# Patient Record
Sex: Female | Born: 1965 | Race: Asian | Hispanic: No | Smoking: Never smoker
Health system: Southern US, Community
[De-identification: ages and names within clinical notes are randomized; demographics above are authoritative.]

## PROBLEM LIST (undated history)

## (undated) DIAGNOSIS — I8393 Asymptomatic varicose veins of bilateral lower extremities: Secondary | ICD-10-CM

## (undated) DIAGNOSIS — E7800 Pure hypercholesterolemia, unspecified: Secondary | ICD-10-CM

## (undated) DIAGNOSIS — N951 Menopausal and female climacteric states: Secondary | ICD-10-CM

## (undated) DIAGNOSIS — G629 Polyneuropathy, unspecified: Secondary | ICD-10-CM

## (undated) DIAGNOSIS — J309 Allergic rhinitis, unspecified: Secondary | ICD-10-CM

## (undated) HISTORY — DX: Allergic rhinitis, unspecified: J30.9

## (undated) HISTORY — DX: Menopausal and female climacteric states: N95.1

## (undated) HISTORY — DX: Asymptomatic varicose veins of bilateral lower extremities: I83.93

## (undated) HISTORY — DX: Pure hypercholesterolemia, unspecified: E78.00

## (undated) HISTORY — DX: Polyneuropathy, unspecified: G62.9

---

## 1997-06-12 ENCOUNTER — Ambulatory Visit (HOSPITAL_COMMUNITY): Admission: RE | Admit: 1997-06-12 | Discharge: 1997-06-12 | Payer: Self-pay | Admitting: Obstetrics and Gynecology

## 1997-08-09 ENCOUNTER — Inpatient Hospital Stay (HOSPITAL_COMMUNITY): Admission: AD | Admit: 1997-08-09 | Discharge: 1997-08-10 | Payer: Self-pay | Admitting: Obstetrics and Gynecology

## 1998-11-29 ENCOUNTER — Other Ambulatory Visit: Admission: RE | Admit: 1998-11-29 | Discharge: 1998-11-29 | Payer: Self-pay | Admitting: Family Medicine

## 1999-05-11 ENCOUNTER — Encounter (INDEPENDENT_AMBULATORY_CARE_PROVIDER_SITE_OTHER): Payer: Self-pay

## 1999-05-11 ENCOUNTER — Other Ambulatory Visit: Admission: RE | Admit: 1999-05-11 | Discharge: 1999-05-11 | Payer: Self-pay | Admitting: Obstetrics and Gynecology

## 1999-09-09 ENCOUNTER — Other Ambulatory Visit: Admission: RE | Admit: 1999-09-09 | Discharge: 1999-09-09 | Payer: Self-pay | Admitting: Obstetrics and Gynecology

## 2000-01-05 ENCOUNTER — Other Ambulatory Visit: Admission: RE | Admit: 2000-01-05 | Discharge: 2000-01-05 | Payer: Self-pay | Admitting: Obstetrics and Gynecology

## 2001-01-07 ENCOUNTER — Other Ambulatory Visit: Admission: RE | Admit: 2001-01-07 | Discharge: 2001-01-07 | Payer: Self-pay | Admitting: Obstetrics and Gynecology

## 2002-01-30 ENCOUNTER — Other Ambulatory Visit: Admission: RE | Admit: 2002-01-30 | Discharge: 2002-01-30 | Payer: Self-pay | Admitting: Obstetrics and Gynecology

## 2002-07-12 ENCOUNTER — Ambulatory Visit (HOSPITAL_COMMUNITY): Admission: RE | Admit: 2002-07-12 | Discharge: 2002-07-12 | Payer: Self-pay | Admitting: Family Medicine

## 2002-07-12 ENCOUNTER — Encounter: Payer: Self-pay | Admitting: Family Medicine

## 2003-02-02 ENCOUNTER — Other Ambulatory Visit: Admission: RE | Admit: 2003-02-02 | Discharge: 2003-02-02 | Payer: Self-pay | Admitting: Obstetrics and Gynecology

## 2004-02-15 ENCOUNTER — Other Ambulatory Visit: Admission: RE | Admit: 2004-02-15 | Discharge: 2004-02-15 | Payer: Self-pay | Admitting: Obstetrics and Gynecology

## 2007-05-28 ENCOUNTER — Encounter: Admission: RE | Admit: 2007-05-28 | Discharge: 2007-05-28 | Payer: Self-pay | Admitting: Obstetrics and Gynecology

## 2008-06-30 ENCOUNTER — Encounter: Admission: RE | Admit: 2008-06-30 | Discharge: 2008-06-30 | Payer: Self-pay | Admitting: Obstetrics and Gynecology

## 2009-07-02 ENCOUNTER — Encounter: Admission: RE | Admit: 2009-07-02 | Discharge: 2009-07-02 | Payer: Self-pay | Admitting: Obstetrics and Gynecology

## 2010-01-30 ENCOUNTER — Encounter: Payer: Self-pay | Admitting: Family Medicine

## 2010-06-09 ENCOUNTER — Other Ambulatory Visit: Payer: Self-pay | Admitting: Obstetrics and Gynecology

## 2010-06-09 DIAGNOSIS — Z1231 Encounter for screening mammogram for malignant neoplasm of breast: Secondary | ICD-10-CM

## 2010-07-08 ENCOUNTER — Ambulatory Visit
Admission: RE | Admit: 2010-07-08 | Discharge: 2010-07-08 | Disposition: A | Discharge status: Home / Self Care | Payer: Commercial Indemnity | Source: Ambulatory Visit | Attending: Obstetrics and Gynecology | Admitting: Obstetrics and Gynecology

## 2010-07-08 DIAGNOSIS — Z1231 Encounter for screening mammogram for malignant neoplasm of breast: Secondary | ICD-10-CM

## 2011-06-28 ENCOUNTER — Other Ambulatory Visit: Payer: Self-pay | Admitting: Obstetrics and Gynecology

## 2011-06-28 DIAGNOSIS — Z1231 Encounter for screening mammogram for malignant neoplasm of breast: Secondary | ICD-10-CM

## 2011-07-12 ENCOUNTER — Ambulatory Visit
Admission: RE | Admit: 2011-07-12 | Discharge: 2011-07-12 | Disposition: A | Discharge status: Home / Self Care | Payer: Commercial Indemnity | Source: Ambulatory Visit | Attending: Obstetrics and Gynecology | Admitting: Obstetrics and Gynecology

## 2011-07-12 DIAGNOSIS — Z1231 Encounter for screening mammogram for malignant neoplasm of breast: Secondary | ICD-10-CM

## 2012-07-11 ENCOUNTER — Other Ambulatory Visit: Payer: Self-pay

## 2012-07-11 DIAGNOSIS — Z1231 Encounter for screening mammogram for malignant neoplasm of breast: Secondary | ICD-10-CM

## 2012-08-09 ENCOUNTER — Ambulatory Visit
Admission: RE | Admit: 2012-08-09 | Discharge: 2012-08-09 | Disposition: A | Discharge status: Home / Self Care | Payer: Commercial Indemnity | Source: Ambulatory Visit

## 2012-08-09 DIAGNOSIS — Z1231 Encounter for screening mammogram for malignant neoplasm of breast: Secondary | ICD-10-CM

## 2015-02-02 ENCOUNTER — Other Ambulatory Visit: Payer: Self-pay | Admitting: Family Medicine

## 2015-02-02 DIAGNOSIS — R1084 Generalized abdominal pain: Secondary | ICD-10-CM

## 2015-02-05 ENCOUNTER — Ambulatory Visit
Admission: RE | Admit: 2015-02-05 | Discharge: 2015-02-05 | Disposition: A | Discharge status: Home / Self Care | Payer: BLUE CROSS/BLUE SHIELD | Source: Ambulatory Visit | Attending: Family Medicine | Admitting: Family Medicine

## 2015-02-05 DIAGNOSIS — R1084 Generalized abdominal pain: Secondary | ICD-10-CM

## 2015-02-05 MED ORDER — IOPAMIDOL (ISOVUE-300) INJECTION 61%
100.0000 mL | Freq: Once | INTRAVENOUS | Status: AC | PRN
  Administered 2015-02-05: 100 mL via INTRAVENOUS

## 2015-09-17 DIAGNOSIS — Z130 Encounter for screening for diseases of the blood and blood-forming organs and certain disorders involving the immune mechanism: Secondary | ICD-10-CM | POA: Diagnosis not present

## 2015-09-17 DIAGNOSIS — Z1389 Encounter for screening for other disorder: Secondary | ICD-10-CM | POA: Diagnosis not present

## 2015-09-17 DIAGNOSIS — Z01419 Encounter for gynecological examination (general) (routine) without abnormal findings: Secondary | ICD-10-CM | POA: Diagnosis not present

## 2015-09-17 DIAGNOSIS — Z1231 Encounter for screening mammogram for malignant neoplasm of breast: Secondary | ICD-10-CM | POA: Diagnosis not present

## 2015-09-17 DIAGNOSIS — Z30431 Encounter for routine checking of intrauterine contraceptive device: Secondary | ICD-10-CM | POA: Diagnosis not present

## 2015-09-17 DIAGNOSIS — Z681 Body mass index (BMI) 19 or less, adult: Secondary | ICD-10-CM | POA: Diagnosis not present

## 2016-03-24 DIAGNOSIS — Z0000 Encounter for general adult medical examination without abnormal findings: Secondary | ICD-10-CM | POA: Diagnosis not present

## 2016-03-24 DIAGNOSIS — M79641 Pain in right hand: Secondary | ICD-10-CM | POA: Diagnosis not present

## 2016-03-24 DIAGNOSIS — E7800 Pure hypercholesterolemia, unspecified: Secondary | ICD-10-CM | POA: Diagnosis not present

## 2016-03-24 DIAGNOSIS — M25549 Pain in joints of unspecified hand: Secondary | ICD-10-CM | POA: Diagnosis not present

## 2016-03-24 DIAGNOSIS — M79642 Pain in left hand: Secondary | ICD-10-CM | POA: Diagnosis not present

## 2016-04-14 DIAGNOSIS — M65331 Trigger finger, right middle finger: Secondary | ICD-10-CM | POA: Diagnosis not present

## 2016-05-08 DIAGNOSIS — M40202 Unspecified kyphosis, cervical region: Secondary | ICD-10-CM | POA: Diagnosis not present

## 2016-05-08 DIAGNOSIS — M9901 Segmental and somatic dysfunction of cervical region: Secondary | ICD-10-CM | POA: Diagnosis not present

## 2016-05-08 DIAGNOSIS — M4722 Other spondylosis with radiculopathy, cervical region: Secondary | ICD-10-CM | POA: Diagnosis not present

## 2016-05-08 DIAGNOSIS — M5032 Other cervical disc degeneration, mid-cervical region, unspecified level: Secondary | ICD-10-CM | POA: Diagnosis not present

## 2016-05-10 DIAGNOSIS — M4722 Other spondylosis with radiculopathy, cervical region: Secondary | ICD-10-CM | POA: Diagnosis not present

## 2016-05-10 DIAGNOSIS — M40202 Unspecified kyphosis, cervical region: Secondary | ICD-10-CM | POA: Diagnosis not present

## 2016-05-10 DIAGNOSIS — M9901 Segmental and somatic dysfunction of cervical region: Secondary | ICD-10-CM | POA: Diagnosis not present

## 2016-05-10 DIAGNOSIS — M5032 Other cervical disc degeneration, mid-cervical region, unspecified level: Secondary | ICD-10-CM | POA: Diagnosis not present

## 2016-05-11 DIAGNOSIS — M4722 Other spondylosis with radiculopathy, cervical region: Secondary | ICD-10-CM | POA: Diagnosis not present

## 2016-05-11 DIAGNOSIS — M5032 Other cervical disc degeneration, mid-cervical region, unspecified level: Secondary | ICD-10-CM | POA: Diagnosis not present

## 2016-05-11 DIAGNOSIS — M40202 Unspecified kyphosis, cervical region: Secondary | ICD-10-CM | POA: Diagnosis not present

## 2016-05-11 DIAGNOSIS — M9901 Segmental and somatic dysfunction of cervical region: Secondary | ICD-10-CM | POA: Diagnosis not present

## 2016-05-15 DIAGNOSIS — M40202 Unspecified kyphosis, cervical region: Secondary | ICD-10-CM | POA: Diagnosis not present

## 2016-05-15 DIAGNOSIS — M9901 Segmental and somatic dysfunction of cervical region: Secondary | ICD-10-CM | POA: Diagnosis not present

## 2016-05-15 DIAGNOSIS — M4722 Other spondylosis with radiculopathy, cervical region: Secondary | ICD-10-CM | POA: Diagnosis not present

## 2016-05-15 DIAGNOSIS — M5032 Other cervical disc degeneration, mid-cervical region, unspecified level: Secondary | ICD-10-CM | POA: Diagnosis not present

## 2016-05-17 DIAGNOSIS — M9901 Segmental and somatic dysfunction of cervical region: Secondary | ICD-10-CM | POA: Diagnosis not present

## 2016-05-17 DIAGNOSIS — M40202 Unspecified kyphosis, cervical region: Secondary | ICD-10-CM | POA: Diagnosis not present

## 2016-05-17 DIAGNOSIS — M5032 Other cervical disc degeneration, mid-cervical region, unspecified level: Secondary | ICD-10-CM | POA: Diagnosis not present

## 2016-05-17 DIAGNOSIS — M4722 Other spondylosis with radiculopathy, cervical region: Secondary | ICD-10-CM | POA: Diagnosis not present

## 2016-05-18 DIAGNOSIS — M9901 Segmental and somatic dysfunction of cervical region: Secondary | ICD-10-CM | POA: Diagnosis not present

## 2016-05-18 DIAGNOSIS — M40202 Unspecified kyphosis, cervical region: Secondary | ICD-10-CM | POA: Diagnosis not present

## 2016-05-18 DIAGNOSIS — M5032 Other cervical disc degeneration, mid-cervical region, unspecified level: Secondary | ICD-10-CM | POA: Diagnosis not present

## 2016-05-18 DIAGNOSIS — M4722 Other spondylosis with radiculopathy, cervical region: Secondary | ICD-10-CM | POA: Diagnosis not present

## 2016-05-22 DIAGNOSIS — M9901 Segmental and somatic dysfunction of cervical region: Secondary | ICD-10-CM | POA: Diagnosis not present

## 2016-05-22 DIAGNOSIS — M5032 Other cervical disc degeneration, mid-cervical region, unspecified level: Secondary | ICD-10-CM | POA: Diagnosis not present

## 2016-05-22 DIAGNOSIS — M40202 Unspecified kyphosis, cervical region: Secondary | ICD-10-CM | POA: Diagnosis not present

## 2016-05-22 DIAGNOSIS — M4722 Other spondylosis with radiculopathy, cervical region: Secondary | ICD-10-CM | POA: Diagnosis not present

## 2016-05-24 DIAGNOSIS — M9901 Segmental and somatic dysfunction of cervical region: Secondary | ICD-10-CM | POA: Diagnosis not present

## 2016-05-24 DIAGNOSIS — M40202 Unspecified kyphosis, cervical region: Secondary | ICD-10-CM | POA: Diagnosis not present

## 2016-05-24 DIAGNOSIS — M4722 Other spondylosis with radiculopathy, cervical region: Secondary | ICD-10-CM | POA: Diagnosis not present

## 2016-05-24 DIAGNOSIS — M5032 Other cervical disc degeneration, mid-cervical region, unspecified level: Secondary | ICD-10-CM | POA: Diagnosis not present

## 2016-05-25 DIAGNOSIS — M9901 Segmental and somatic dysfunction of cervical region: Secondary | ICD-10-CM | POA: Diagnosis not present

## 2016-05-25 DIAGNOSIS — M4722 Other spondylosis with radiculopathy, cervical region: Secondary | ICD-10-CM | POA: Diagnosis not present

## 2016-05-25 DIAGNOSIS — M40202 Unspecified kyphosis, cervical region: Secondary | ICD-10-CM | POA: Diagnosis not present

## 2016-05-25 DIAGNOSIS — M5032 Other cervical disc degeneration, mid-cervical region, unspecified level: Secondary | ICD-10-CM | POA: Diagnosis not present

## 2016-05-29 DIAGNOSIS — M5032 Other cervical disc degeneration, mid-cervical region, unspecified level: Secondary | ICD-10-CM | POA: Diagnosis not present

## 2016-05-29 DIAGNOSIS — M9901 Segmental and somatic dysfunction of cervical region: Secondary | ICD-10-CM | POA: Diagnosis not present

## 2016-05-29 DIAGNOSIS — M40202 Unspecified kyphosis, cervical region: Secondary | ICD-10-CM | POA: Diagnosis not present

## 2016-05-29 DIAGNOSIS — M4722 Other spondylosis with radiculopathy, cervical region: Secondary | ICD-10-CM | POA: Diagnosis not present

## 2016-06-01 DIAGNOSIS — M40202 Unspecified kyphosis, cervical region: Secondary | ICD-10-CM | POA: Diagnosis not present

## 2016-06-01 DIAGNOSIS — M9901 Segmental and somatic dysfunction of cervical region: Secondary | ICD-10-CM | POA: Diagnosis not present

## 2016-06-01 DIAGNOSIS — M5032 Other cervical disc degeneration, mid-cervical region, unspecified level: Secondary | ICD-10-CM | POA: Diagnosis not present

## 2016-06-01 DIAGNOSIS — M4722 Other spondylosis with radiculopathy, cervical region: Secondary | ICD-10-CM | POA: Diagnosis not present

## 2016-06-06 DIAGNOSIS — M40202 Unspecified kyphosis, cervical region: Secondary | ICD-10-CM | POA: Diagnosis not present

## 2016-06-06 DIAGNOSIS — M5032 Other cervical disc degeneration, mid-cervical region, unspecified level: Secondary | ICD-10-CM | POA: Diagnosis not present

## 2016-06-06 DIAGNOSIS — M4722 Other spondylosis with radiculopathy, cervical region: Secondary | ICD-10-CM | POA: Diagnosis not present

## 2016-06-06 DIAGNOSIS — M9901 Segmental and somatic dysfunction of cervical region: Secondary | ICD-10-CM | POA: Diagnosis not present

## 2016-06-08 DIAGNOSIS — M40202 Unspecified kyphosis, cervical region: Secondary | ICD-10-CM | POA: Diagnosis not present

## 2016-06-08 DIAGNOSIS — M5032 Other cervical disc degeneration, mid-cervical region, unspecified level: Secondary | ICD-10-CM | POA: Diagnosis not present

## 2016-06-08 DIAGNOSIS — M4722 Other spondylosis with radiculopathy, cervical region: Secondary | ICD-10-CM | POA: Diagnosis not present

## 2016-06-08 DIAGNOSIS — M9901 Segmental and somatic dysfunction of cervical region: Secondary | ICD-10-CM | POA: Diagnosis not present

## 2016-06-12 DIAGNOSIS — G629 Polyneuropathy, unspecified: Secondary | ICD-10-CM | POA: Diagnosis not present

## 2016-06-12 DIAGNOSIS — I8393 Asymptomatic varicose veins of bilateral lower extremities: Secondary | ICD-10-CM | POA: Diagnosis not present

## 2016-06-14 DIAGNOSIS — M4722 Other spondylosis with radiculopathy, cervical region: Secondary | ICD-10-CM | POA: Diagnosis not present

## 2016-06-14 DIAGNOSIS — M9901 Segmental and somatic dysfunction of cervical region: Secondary | ICD-10-CM | POA: Diagnosis not present

## 2016-06-14 DIAGNOSIS — M40202 Unspecified kyphosis, cervical region: Secondary | ICD-10-CM | POA: Diagnosis not present

## 2016-06-14 DIAGNOSIS — M5032 Other cervical disc degeneration, mid-cervical region, unspecified level: Secondary | ICD-10-CM | POA: Diagnosis not present

## 2016-06-15 DIAGNOSIS — M40202 Unspecified kyphosis, cervical region: Secondary | ICD-10-CM | POA: Diagnosis not present

## 2016-06-15 DIAGNOSIS — M5032 Other cervical disc degeneration, mid-cervical region, unspecified level: Secondary | ICD-10-CM | POA: Diagnosis not present

## 2016-06-15 DIAGNOSIS — M9901 Segmental and somatic dysfunction of cervical region: Secondary | ICD-10-CM | POA: Diagnosis not present

## 2016-06-15 DIAGNOSIS — M4722 Other spondylosis with radiculopathy, cervical region: Secondary | ICD-10-CM | POA: Diagnosis not present

## 2016-06-19 DIAGNOSIS — M9901 Segmental and somatic dysfunction of cervical region: Secondary | ICD-10-CM | POA: Diagnosis not present

## 2016-06-19 DIAGNOSIS — M4722 Other spondylosis with radiculopathy, cervical region: Secondary | ICD-10-CM | POA: Diagnosis not present

## 2016-06-19 DIAGNOSIS — M5032 Other cervical disc degeneration, mid-cervical region, unspecified level: Secondary | ICD-10-CM | POA: Diagnosis not present

## 2016-06-19 DIAGNOSIS — M40202 Unspecified kyphosis, cervical region: Secondary | ICD-10-CM | POA: Diagnosis not present

## 2016-07-03 ENCOUNTER — Other Ambulatory Visit: Payer: Self-pay

## 2016-07-03 DIAGNOSIS — I83813 Varicose veins of bilateral lower extremities with pain: Secondary | ICD-10-CM

## 2016-07-11 ENCOUNTER — Encounter: Payer: Self-pay | Admitting: Vascular Surgery

## 2016-08-22 ENCOUNTER — Encounter: Payer: Self-pay | Admitting: Vascular Surgery

## 2016-08-23 DIAGNOSIS — G629 Polyneuropathy, unspecified: Secondary | ICD-10-CM | POA: Diagnosis not present

## 2016-08-23 DIAGNOSIS — M2550 Pain in unspecified joint: Secondary | ICD-10-CM | POA: Diagnosis not present

## 2016-09-01 ENCOUNTER — Encounter: Payer: Self-pay | Admitting: Vascular Surgery

## 2016-09-01 ENCOUNTER — Ambulatory Visit (INDEPENDENT_AMBULATORY_CARE_PROVIDER_SITE_OTHER): Payer: BLUE CROSS/BLUE SHIELD | Admitting: Vascular Surgery

## 2016-09-01 ENCOUNTER — Ambulatory Visit (HOSPITAL_COMMUNITY)
Admission: RE | Admit: 2016-09-01 | Discharge: 2016-09-01 | Disposition: A | Discharge status: Home / Self Care | Payer: BLUE CROSS/BLUE SHIELD | Source: Ambulatory Visit | Attending: Vascular Surgery | Admitting: Vascular Surgery

## 2016-09-01 VITALS — BP 114/75 | HR 67 | Temp 97.4°F | Ht 61.0 in | Wt 108.1 lb

## 2016-09-01 DIAGNOSIS — I83813 Varicose veins of bilateral lower extremities with pain: Secondary | ICD-10-CM

## 2016-09-01 DIAGNOSIS — L97919 Non-pressure chronic ulcer of unspecified part of right lower leg with unspecified severity: Secondary | ICD-10-CM | POA: Diagnosis not present

## 2016-09-01 DIAGNOSIS — I83019 Varicose veins of right lower extremity with ulcer of unspecified site: Secondary | ICD-10-CM

## 2016-09-01 DIAGNOSIS — M7989 Other specified soft tissue disorders: Secondary | ICD-10-CM | POA: Diagnosis not present

## 2016-09-01 NOTE — Progress Notes (Signed)
Patient ID: Ebony Ramirez, female   DOB: 1965/07/13, 51 y.o.   MRN: [id number]  Reason for Consult: Establish Care   Referred by Johny Blamer, MD  Subjective:     HPI:  Ebony Ramirez is a 51 y.o. female for evaluation of right lower leg pain. She states that she has had some pain that came on suddenly and has not gone away and is associated with some small varicosities in the leg. She is not had x-ray. She was attempted anti-inflammatory medications but have not helped. She states that does not help with rest and is exacerbated with walking and any pressure applied. She is able to continue to walk overall continues to work. She does not have rest pain or tissue loss. She does not have cramping with walking. She has no history of vascular disease.  Past Medical History:  Diagnosis Date  . Perimenopausal   . Peripheral neuropathy   . Pure hypercholesterolemia   . Rhinitis, allergic   . Varicose veins of legs    Family History  Problem Relation Age of Onset  . Stroke Mother   . Hyperlipidemia Mother   . Hypertension Mother   . Diabetes Mother    History reviewed. No pertinent surgical history.  Short Social History:  Social History  Substance Use Topics  . Smoking status: Never Smoker  . Smokeless tobacco: Never Used  . Alcohol use No    No Known Allergies  Current Outpatient Prescriptions  Medication Sig Dispense Refill  . azelastine (OPTIVAR) 0.05 % ophthalmic solution 1 drop 2 (two) times daily.    . fluticasone (FLONASE) 50 MCG/ACT nasal spray Place into both nostrils daily.    . meloxicam (MOBIC) 15 MG tablet Take 15 mg by mouth daily.    Marland Kitchen olopatadine (PATANOL) 0.1 % ophthalmic solution 1 drop 2 (two) times daily.    . pregabalin (LYRICA) 75 MG capsule Take 75 mg by mouth 2 (two) times daily.    Marland Kitchen triamcinolone cream (KENALOG) 0.1 % Apply 1 application topically 2 (two) times daily.     No current facility-administered medications for this visit.      Review of Systems  Constitutional:  Constitutional negative. Eyes: Eyes negative.  Respiratory: Respiratory negative.  Cardiovascular: Cardiovascular negative.  GI: Gastrointestinal negative.  Musculoskeletal: Positive for leg pain and joint pain.  Neurological: Neurological negative. Hematologic: Hematologic/lymphatic negative.  Psychiatric: Psychiatric negative.        Objective:  Objective   Vitals:   09/01/16 1354  BP: 114/75  Pulse: 67  Temp: (!) 97.4 F (36.3 C)  TempSrc: Oral  SpO2: 100%  Weight: 108 lb 1.6 oz (49 kg)  Height: 5\' 1"  (1.549 m)   Body mass index is 20.43 kg/m.  Physical Exam  Constitutional: She is oriented to person, place, and time. She appears well-developed.  HENT:  Head: Normocephalic.  Neck: Normal range of motion. Neck supple.  Cardiovascular: Normal rate.   Pulses:      Carotid pulses are 2+ on the right side, and 2+ on the left side.      Popliteal pulses are 2+ on the right side, and 2+ on the left side.       Dorsalis pedis pulses are 2+ on the right side, and 2+ on the left side.  Pulmonary/Chest: Effort normal.  Abdominal: Soft. She exhibits no mass.  Musculoskeletal:  Very few veins noted near right ankle, no varicosities or spider veins  Lymphadenopathy:    She has no  cervical adenopathy.  Neurological: She is alert and oriented to person, place, and time.  Skin: Skin is warm and dry.  Psychiatric: She has a normal mood and affect. Judgment and thought content normal.    Data: I apparently interpreted her lower extremity venous duplex evaluation which shows venous incompetence noted in the bilateral small saphenous veins although greatest diameter is under 0.3 cm. She also has incompetence noted in the right common femoral femoral saphenofemoral junction and great saphenous vein which is for very short segments in this to is less than 0.4 cm in size.     Assessment/Plan:     51 year-old female here for venous  evaluation of bilateral lower extremities where she has pain in her right ankle. She has never had a DVT in the pain does not appear to be coming from any varicosities. As such she has no reflux in these veins there is no vascular intervention that I would recommend at this time. If she has continued pain despite anti-inflammatory medications I have suggested possible x-ray but no vascular intervention that I can think of. She demonstrates good understanding and is happy with not eating procedures. She can follow-up when necessary.     Maeola Harman MD [facility]

## 2016-10-19 DIAGNOSIS — Z30432 Encounter for removal of intrauterine contraceptive device: Secondary | ICD-10-CM | POA: Diagnosis not present

## 2016-10-19 DIAGNOSIS — Z1389 Encounter for screening for other disorder: Secondary | ICD-10-CM | POA: Diagnosis not present

## 2016-10-19 DIAGNOSIS — Z01419 Encounter for gynecological examination (general) (routine) without abnormal findings: Secondary | ICD-10-CM | POA: Diagnosis not present

## 2016-10-19 DIAGNOSIS — Z130 Encounter for screening for diseases of the blood and blood-forming organs and certain disorders involving the immune mechanism: Secondary | ICD-10-CM | POA: Diagnosis not present

## 2016-10-19 DIAGNOSIS — Z1231 Encounter for screening mammogram for malignant neoplasm of breast: Secondary | ICD-10-CM | POA: Diagnosis not present

## 2016-10-19 DIAGNOSIS — N951 Menopausal and female climacteric states: Secondary | ICD-10-CM | POA: Diagnosis not present

## 2016-10-19 DIAGNOSIS — Z6820 Body mass index (BMI) 20.0-20.9, adult: Secondary | ICD-10-CM | POA: Diagnosis not present

## 2017-03-30 DIAGNOSIS — Z0000 Encounter for general adult medical examination without abnormal findings: Secondary | ICD-10-CM | POA: Diagnosis not present

## 2017-03-30 DIAGNOSIS — E7800 Pure hypercholesterolemia, unspecified: Secondary | ICD-10-CM | POA: Diagnosis not present

## 2017-04-26 DIAGNOSIS — Z1211 Encounter for screening for malignant neoplasm of colon: Secondary | ICD-10-CM | POA: Diagnosis not present

## 2017-04-26 DIAGNOSIS — Z01818 Encounter for other preprocedural examination: Secondary | ICD-10-CM | POA: Diagnosis not present

## 2017-06-01 DIAGNOSIS — K6389 Other specified diseases of intestine: Secondary | ICD-10-CM | POA: Diagnosis not present

## 2017-06-01 DIAGNOSIS — Z1211 Encounter for screening for malignant neoplasm of colon: Secondary | ICD-10-CM | POA: Diagnosis not present

## 2017-06-01 DIAGNOSIS — K635 Polyp of colon: Secondary | ICD-10-CM | POA: Diagnosis not present

## 2017-06-01 DIAGNOSIS — K633 Ulcer of intestine: Secondary | ICD-10-CM | POA: Diagnosis not present

## 2017-06-01 DIAGNOSIS — K529 Noninfective gastroenteritis and colitis, unspecified: Secondary | ICD-10-CM | POA: Diagnosis not present

## 2017-06-08 DIAGNOSIS — K635 Polyp of colon: Secondary | ICD-10-CM | POA: Diagnosis not present

## 2017-06-08 DIAGNOSIS — K529 Noninfective gastroenteritis and colitis, unspecified: Secondary | ICD-10-CM | POA: Diagnosis not present

## 2017-06-08 DIAGNOSIS — Z1211 Encounter for screening for malignant neoplasm of colon: Secondary | ICD-10-CM | POA: Diagnosis not present

## 2017-08-06 DIAGNOSIS — K559 Vascular disorder of intestine, unspecified: Secondary | ICD-10-CM | POA: Diagnosis not present

## 2017-09-09 IMAGING — CT CT ABD-PELV W/ CM
2 of 5 series · 10 of 36 positions shown, 17 images · IV contrast (READICAT/WATER & [ID] ISOVUE 300)
Comparison: None.

CLINICAL DATA: Generalized abdominal pain for 4 weeks. Occasional
melena. Initial encounter.

EXAM:
CT ABDOMEN AND PELVIS WITH CONTRAST
TECHNIQUE: Multidetector CT imaging of the abdomen and pelvis was performed
using the standard protocol following bolus administration of
intravenous contrast.
CONTRAST:  100 mL 9KJAUM-V55 IOPAMIDOL (9KJAUM-V55) INJECTION 61%

[Series 3: abd/pelvis with · axial · 0.61mm/px · z∈[-313,+42]mm · 9 of 89 slices shown, 15 images]
[im 9/89  soft-tissue]
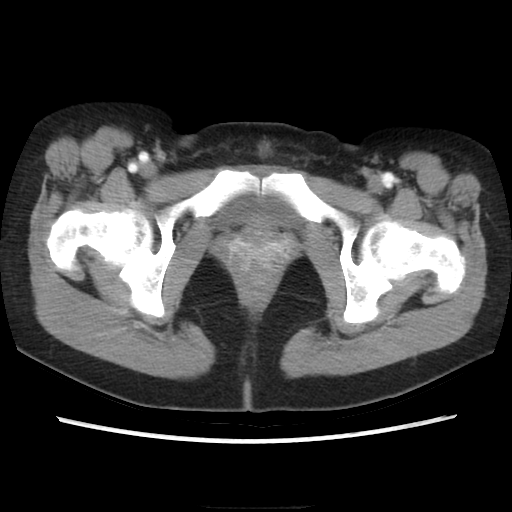
[im 9/89  bone]
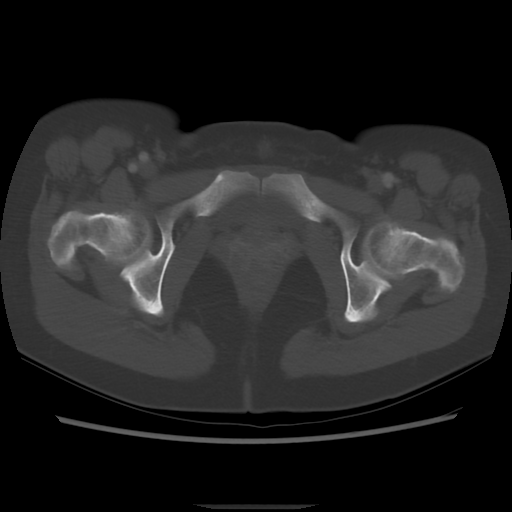
[im 18/89  soft-tissue]
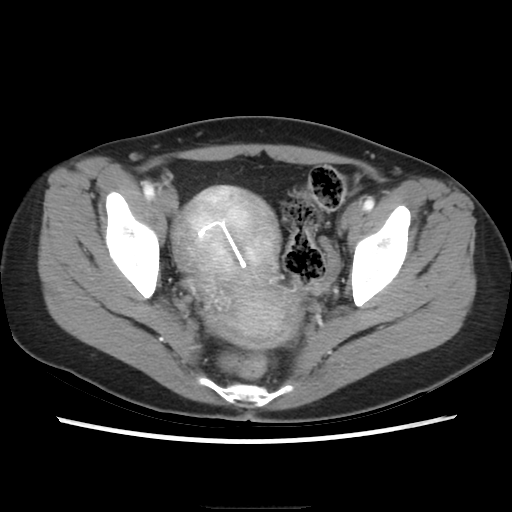
[im 27/89  soft-tissue]
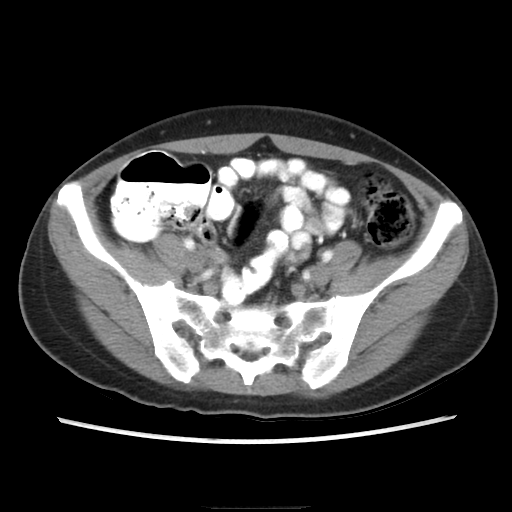
[im 36/89  soft-tissue]
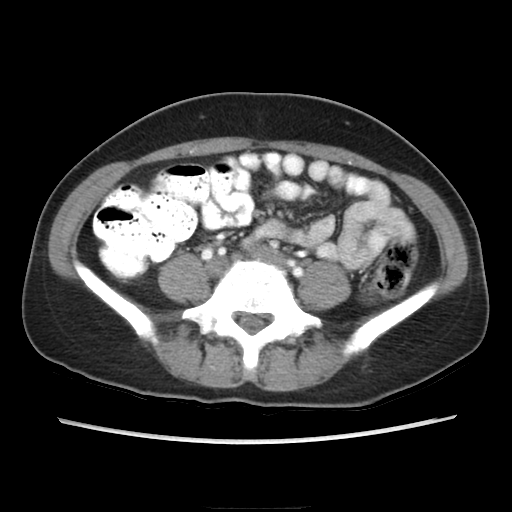
[im 45/89  soft-tissue]
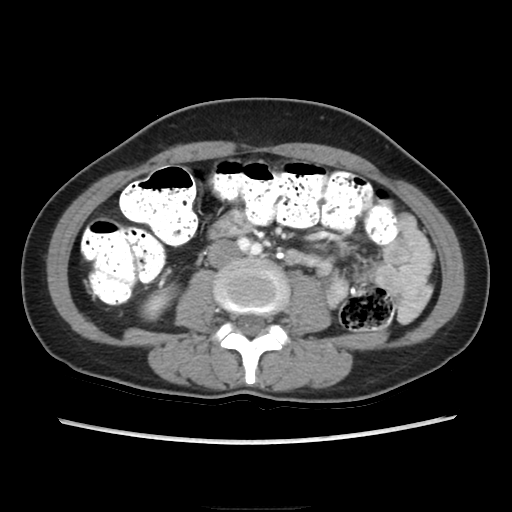
[im 53/89  soft-tissue]
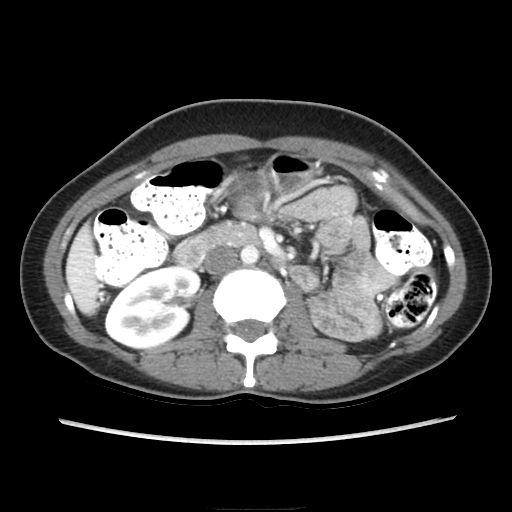
[im 53/89  lung]
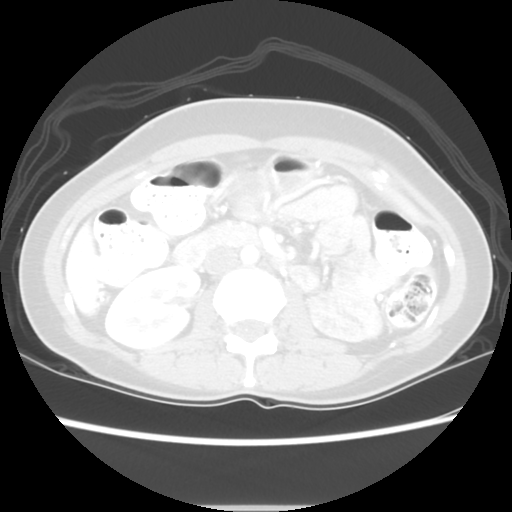
[im 62/89  soft-tissue]
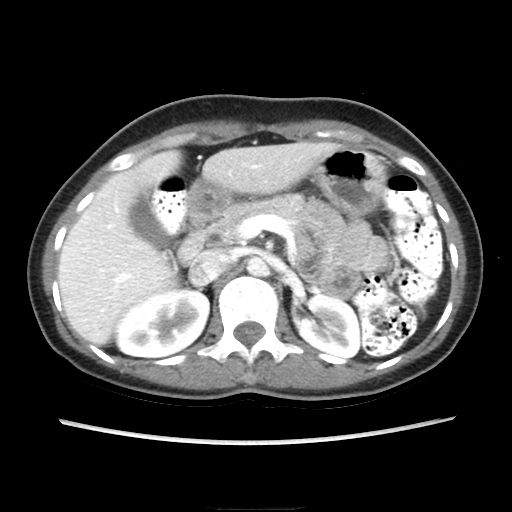
[im 62/89  lung]
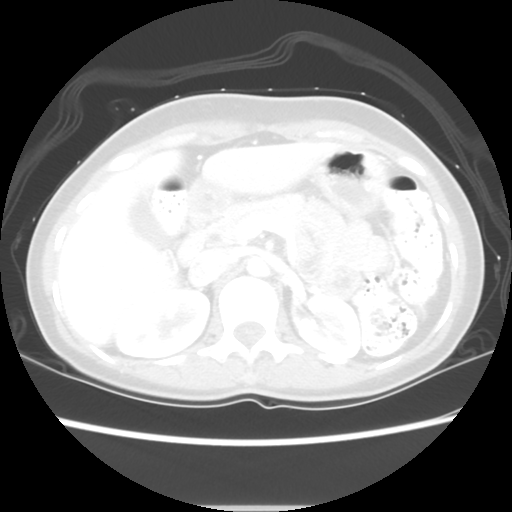
[im 71/89  soft-tissue]
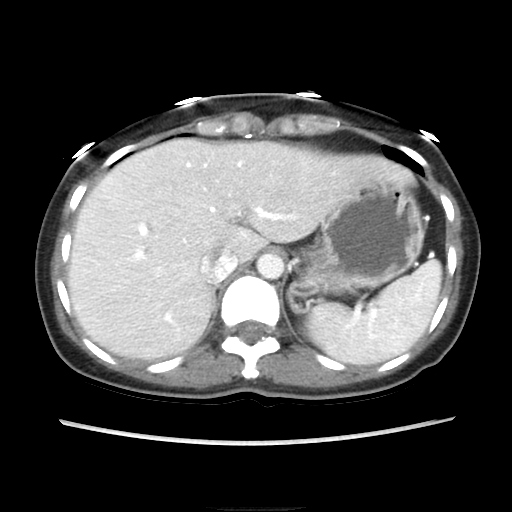
[im 71/89  lung]
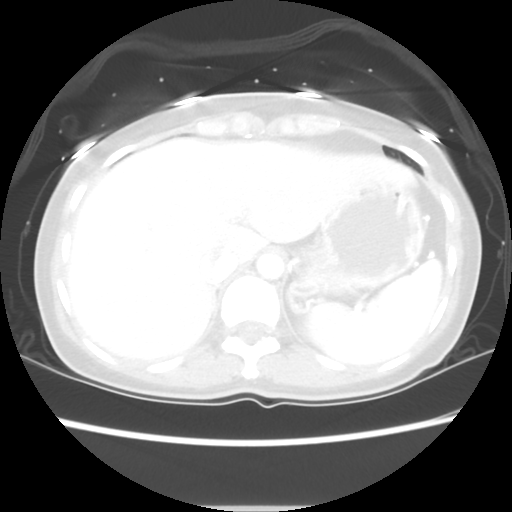
[im 80/89  soft-tissue]
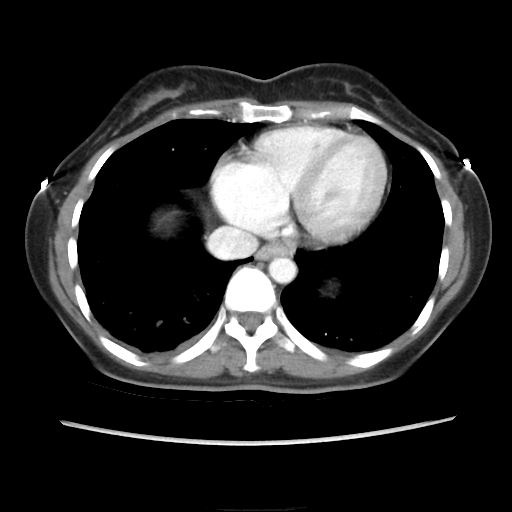
[im 80/89  lung]
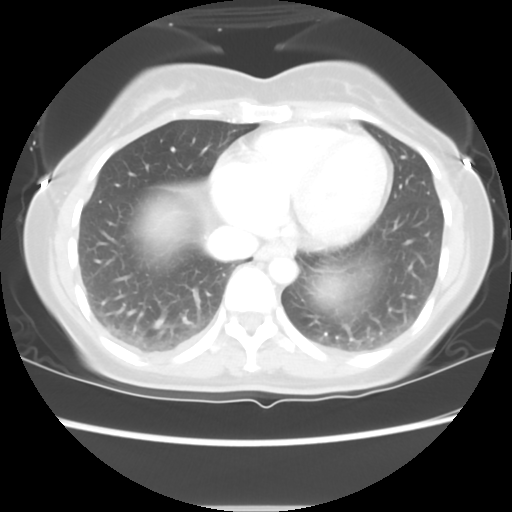
[im 80/89  bone]
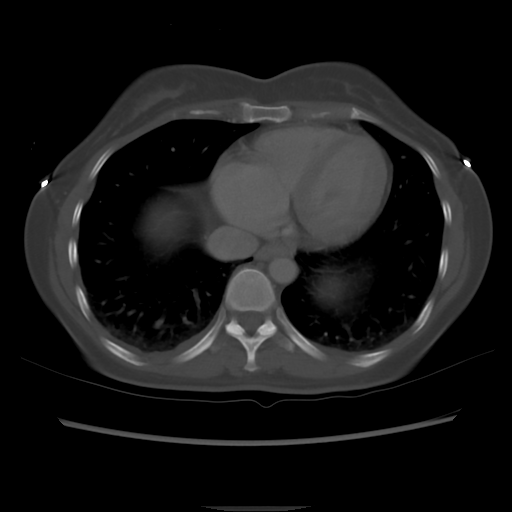

[Series 601: coronal body · coronal · 0.92mm/px · 1 of 98 slices shown, 2 images]
[im 33/98  soft-tissue]
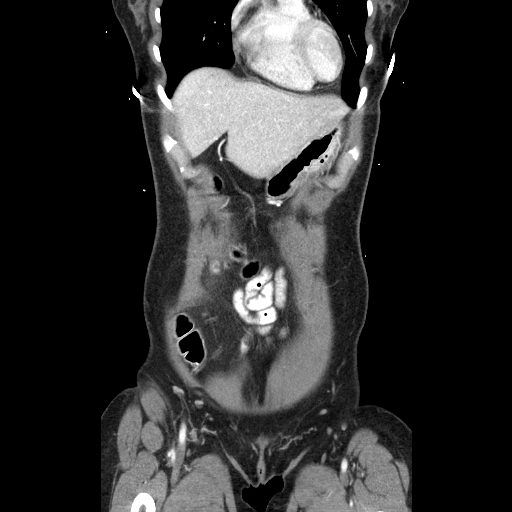
[im 33/98  bone]
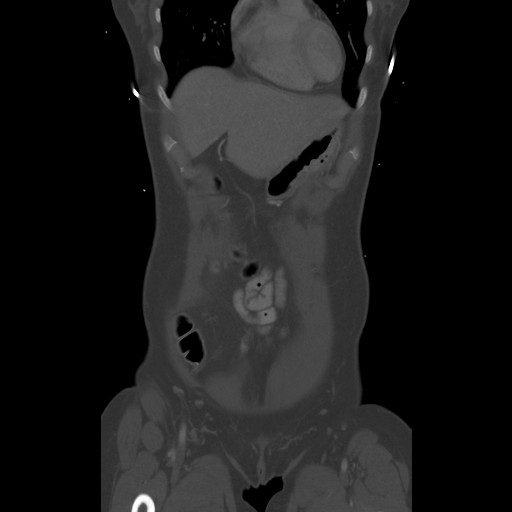

[10 of 36 positions shown; findings below may reference images not displayed]

FINDINGS: The lung bases are clear. No pleural or pericardial effusion. Heart
size is normal.

The gallbladder, liver, spleen, adrenal glands, pancreas and kidneys
appear normal.

IUD is in place in the uterus. Adnexa are unremarkable. Prominent
stool in the ascending and transverse colon is identified. The colon
otherwise appears normal. The stomach, small bowel and appendix are
unremarkable. There is no lymphadenopathy or fluid. No focal bony
abnormality is identified.
IMPRESSION: No acute finding abdomen or pelvis.

Prominent stool burden ascending and transverse colon.

## 2017-10-26 DIAGNOSIS — Z01419 Encounter for gynecological examination (general) (routine) without abnormal findings: Secondary | ICD-10-CM | POA: Diagnosis not present

## 2017-10-26 DIAGNOSIS — Z6820 Body mass index (BMI) 20.0-20.9, adult: Secondary | ICD-10-CM | POA: Diagnosis not present

## 2017-10-26 DIAGNOSIS — Z1231 Encounter for screening mammogram for malignant neoplasm of breast: Secondary | ICD-10-CM | POA: Diagnosis not present

## 2017-10-26 DIAGNOSIS — Z130 Encounter for screening for diseases of the blood and blood-forming organs and certain disorders involving the immune mechanism: Secondary | ICD-10-CM | POA: Diagnosis not present

## 2017-10-26 DIAGNOSIS — N921 Excessive and frequent menstruation with irregular cycle: Secondary | ICD-10-CM | POA: Diagnosis not present

## 2017-10-26 DIAGNOSIS — Z1389 Encounter for screening for other disorder: Secondary | ICD-10-CM | POA: Diagnosis not present

## 2017-11-02 ENCOUNTER — Other Ambulatory Visit: Payer: Self-pay | Admitting: Obstetrics and Gynecology

## 2017-11-02 DIAGNOSIS — N6489 Other specified disorders of breast: Secondary | ICD-10-CM

## 2017-11-09 ENCOUNTER — Ambulatory Visit: Payer: BLUE CROSS/BLUE SHIELD

## 2017-11-09 ENCOUNTER — Ambulatory Visit
Admission: RE | Admit: 2017-11-09 | Discharge: 2017-11-09 | Disposition: A | Discharge status: Home / Self Care | Payer: BLUE CROSS/BLUE SHIELD | Source: Ambulatory Visit | Attending: Obstetrics and Gynecology | Admitting: Obstetrics and Gynecology

## 2017-11-09 DIAGNOSIS — N6489 Other specified disorders of breast: Secondary | ICD-10-CM

## 2017-11-09 DIAGNOSIS — R922 Inconclusive mammogram: Secondary | ICD-10-CM | POA: Diagnosis not present

## 2018-03-02 ENCOUNTER — Encounter (HOSPITAL_COMMUNITY): Payer: Self-pay

## 2018-03-02 ENCOUNTER — Other Ambulatory Visit: Payer: Self-pay

## 2018-03-02 ENCOUNTER — Emergency Department (HOSPITAL_COMMUNITY)
Admission: EM | Admit: 2018-03-02 | Discharge: 2018-03-02 | Disposition: A | Discharge status: Home / Self Care | Payer: 59 | Attending: Emergency Medicine | Admitting: Emergency Medicine

## 2018-03-02 DIAGNOSIS — J385 Laryngeal spasm: Secondary | ICD-10-CM | POA: Diagnosis not present

## 2018-03-02 DIAGNOSIS — R05 Cough: Secondary | ICD-10-CM | POA: Diagnosis not present

## 2018-03-02 DIAGNOSIS — Z79899 Other long term (current) drug therapy: Secondary | ICD-10-CM | POA: Insufficient documentation

## 2018-03-02 LAB — GROUP A STREP BY PCR: Group A Strep by PCR: NOT DETECTED

## 2018-03-02 MED ORDER — FAMOTIDINE 20 MG PO TABS: 20.0000 mg | ORAL_TABLET | Freq: Two times a day (BID) | ORAL | 0 refills | Status: AC

## 2018-03-02 MED ORDER — IBUPROFEN 600 MG PO TABS: 600.0000 mg | ORAL_TABLET | Freq: Three times a day (TID) | ORAL | 0 refills | Status: AC | PRN

## 2018-03-02 MED ORDER — IBUPROFEN 800 MG PO TABS
800.0000 mg | ORAL_TABLET | Freq: Once | ORAL | Status: AC
  Administered 2018-03-02: 800 mg via ORAL
  Filled 2018-03-02: qty 1

## 2018-03-02 NOTE — ED Triage Notes (Signed)
Pt out shopping today when she had sudden onset of difficulty breathing; pt says she "feels like something is stuck in my throat"; pt endorses similar event 3 weeks ago, resolved on its own;pt endorses pain in throat;  NAD at this time

## 2018-03-02 NOTE — ED Notes (Signed)
Patient verbalizes understanding of discharge instructions. Opportunity for questioning and answers were provided. Pt discharged from ED. 

## 2018-03-02 NOTE — Discharge Instructions (Addendum)
1.  You appear to have had an episode of spasm of the vocal cords.  Sometimes this can be triggered by having had a virus and irritation of the vocal cords, reflux, chemical or inhaled irritants or stressors.  A strep test was checked and was negative for signs of strep throat.  You do not have redness or swelling of your tonsils or membranes in your mouth or throat. 2.  You should contact the ear nose throat doctor in your discharge instructions to set up a follow-up appointment for direct examination of your vocal cords.  You have had information put on your chart including information regarding inducible laryngeal obstruction and voice disorders for additional information about laryngeal spasm. 3.  Take ibuprofen as directed to help with discomfort and inflammation.  Take Pepcid twice daily as prescribed to prevent any reflux or irritation of your stomach while taking ibuprofen. 4.  Return to the emergency department if you develop symptoms such as neck stiffness, increasing sore throat, fever, difficulty breathing or swallowing or other concerning symptoms.

## 2018-03-02 NOTE — ED Provider Notes (Signed)
[facility] EMERGENCY DEPARTMENT Provider Note   CSN: [id number] Arrival date & time: 03/02/18  1645    History   Chief Complaint No chief complaint on file.   HPI Ebony Ramirez is a 53 y.o. female.     HPI Patient reports she had flu about 2 weeks ago.  At that time she was having a lot of coughing and generalized aching.  She reports that she was getting better.  She woke up this morning feeling all right and went to do some shopping.  She reports as she was shopping she got a sudden sensation of "something being stuck in my throat".  She indicates her upper airway around the thyroid cartilage.  She reports that this suddenly felt like it was tight and hard to take a breath.  Those symptoms started to resolve on their own.  She reports she still feels like she has some sore throat and a little bit of pain with swallowing.  Patient is very concerned that this might happen again.  No chest pain, no ongoing cough.  No shortness of breath. Past Medical History:  Diagnosis Date  . Perimenopausal   . Peripheral neuropathy   . Pure hypercholesterolemia   . Rhinitis, allergic   . Varicose veins of legs     There are no active problems to display for this patient.   History reviewed. No pertinent surgical history.   OB History   No obstetric history on file.      Home Medications    Prior to Admission medications   Medication Sig Start Date End Date Taking? Authorizing Provider  ibuprofen (ADVIL,MOTRIN) 200 MG tablet Take 400 mg by mouth every 6 (six) hours as needed for mild pain.   Yes [provider]  Multiple Vitamin (MULTIVITAMIN WITH MINERALS) TABS tablet Take 1 tablet by mouth daily.   Yes [provider]  pregabalin (LYRICA) 75 MG capsule Take 75 mg by mouth 2 (two) times daily.   Yes [provider]  famotidine (PEPCID) 20 MG tablet Take 1 tablet (20 mg total) by mouth 2 (two) times daily. 03/02/18   Arby Barrette, MD    ibuprofen (ADVIL,MOTRIN) 600 MG tablet Take 1 tablet (600 mg total) by mouth every 8 (eight) hours as needed for mild pain or moderate pain. 03/02/18   Arby Barrette, MD    Family History Family History  Problem Relation Age of Onset  . Stroke Mother   . Hyperlipidemia Mother   . Hypertension Mother   . Diabetes Mother     Social History Social History   Tobacco Use  . Smoking status: Never Smoker  . Smokeless tobacco: Never Used  Substance Use Topics  . Alcohol use: No  . Drug use: No     Allergies   Patient has no known allergies.   Review of Systems Review of Systems 10 Systems reviewed and are negative for acute change except as noted in the HPI.   Physical Exam Updated Vital Signs BP (!) 144/86   Pulse 62   Temp 98.1 F (36.7 C) (Oral)   Resp 18   Ht 5\' 1"  (1.549 m)   Wt 47.6 kg   SpO2 98%   BMI 19.84 kg/m   Physical Exam Constitutional:      Appearance: Normal appearance. She is well-developed.  HENT:     Head: Normocephalic and atraumatic.     Nose: Nose normal.     Mouth/Throat:     Mouth:  Mucous membranes are moist.     Pharynx: Oropharynx is clear.     Comments: Posterior airway is widely patent.  No erythema or exudates.  No masses or lesions.  Tongue good condition.  Dentition very good condition. Eyes:     Pupils: Pupils are equal, round, and reactive to light.  Neck:     Musculoskeletal: Neck supple.     Comments: Neck is supple.  There is no meningismus.  Anterior neck no palpable thyromegaly.  Patient endorses some discomfort to palpation around the thyroid cartilage.  No appreciable enlargement, no stridor.  No lymphadenopathy. Cardiovascular:     Rate and Rhythm: Normal rate and regular rhythm.     Heart sounds: Normal heart sounds.  Pulmonary:     Effort: Pulmonary effort is normal.     Breath sounds: Normal breath sounds.  Abdominal:     General: Bowel sounds are normal. There is no distension.     Palpations: Abdomen is  soft.     Tenderness: There is no abdominal tenderness.  Musculoskeletal: Normal range of motion.     Right lower leg: No edema.     Left lower leg: No edema.  Skin:    General: Skin is warm and dry.  Neurological:     General: No focal deficit present.     Mental Status: She is alert and oriented to person, place, and time.     GCS: GCS eye subscore is 4. GCS verbal subscore is 5. GCS motor subscore is 6.     Coordination: Coordination normal.      ED Treatments / Results  Labs (all labs ordered are listed, but only abnormal results are displayed) Labs Reviewed  GROUP A STREP BY PCR    EKG None  Radiology No results found.  Procedures Procedures (including critical care time)  Medications Ordered in ED Medications  ibuprofen (ADVIL,MOTRIN) tablet 800 mg (800 mg Oral Given 03/02/18 1737)     Initial Impression / Assessment and Plan / ED Course  I have reviewed the triage vital signs and the nursing notes.  Pertinent labs & imaging results that were available during my care of the patient were reviewed by me and considered in my medical decision making (see chart for details).        Patient is clinically well in appearance with normal vital signs.  She describes an episode while she was shopping at the store where she felt like her throat just suddenly closed and became tight right around her thyroid cartilage.  She felt like for a few seconds she could not breathe.  Symptoms then resolved.  She denies there was any associated chest pain or residual difficulty breathing once that sensation in her throat resolved.  Patient describes flulike symptoms for the past 2 weeks.  At this time I have suspicion that she had laryngeal spasm.  This appears completely resolved.  Patient's voice is not hoarse.  She does not have any objective physical exam signs on the head or neck.  At this time I feel she is stable for discharge.  My recommendations to take ibuprofen and Pepcid.   Patient was very concerned that this might occur again.  I have advised that she will need to return if she has any advancing or changing symptoms.  However at this time I feel she is stable to try outpatient treatment and follow-up with ENT.  Final Clinical Impressions(s) / ED Diagnoses   Final diagnoses:  Laryngospasm  ED Discharge Orders         Ordered    ibuprofen (ADVIL,MOTRIN) 600 MG tablet  Every 8 hours PRN     03/02/18 1818    famotidine (PEPCID) 20 MG tablet  2 times daily     03/02/18 1828           Arby Barrette, MD 03/02/18 [phone]

## 2018-03-19 DIAGNOSIS — K219 Gastro-esophageal reflux disease without esophagitis: Secondary | ICD-10-CM | POA: Diagnosis not present

## 2018-03-19 DIAGNOSIS — J302 Other seasonal allergic rhinitis: Secondary | ICD-10-CM | POA: Diagnosis not present

## 2018-04-05 DIAGNOSIS — R946 Abnormal results of thyroid function studies: Secondary | ICD-10-CM | POA: Diagnosis not present

## 2018-04-05 DIAGNOSIS — E7800 Pure hypercholesterolemia, unspecified: Secondary | ICD-10-CM | POA: Diagnosis not present

## 2018-04-05 DIAGNOSIS — Z0000 Encounter for general adult medical examination without abnormal findings: Secondary | ICD-10-CM | POA: Diagnosis not present

## 2018-04-05 DIAGNOSIS — L308 Other specified dermatitis: Secondary | ICD-10-CM | POA: Diagnosis not present

## 2023-02-22 DIAGNOSIS — Z1231 Encounter for screening mammogram for malignant neoplasm of breast: Secondary | ICD-10-CM | POA: Diagnosis not present

## 2023-02-22 DIAGNOSIS — Z01419 Encounter for gynecological examination (general) (routine) without abnormal findings: Secondary | ICD-10-CM | POA: Diagnosis not present

## 2023-07-06 DIAGNOSIS — Z0000 Encounter for general adult medical examination without abnormal findings: Secondary | ICD-10-CM | POA: Diagnosis not present

## 2023-07-06 DIAGNOSIS — E7800 Pure hypercholesterolemia, unspecified: Secondary | ICD-10-CM | POA: Diagnosis not present

## 2023-07-06 DIAGNOSIS — H8112 Benign paroxysmal vertigo, left ear: Secondary | ICD-10-CM | POA: Diagnosis not present

## 2023-07-06 DIAGNOSIS — E039 Hypothyroidism, unspecified: Secondary | ICD-10-CM | POA: Diagnosis not present
